# Patient Record
Sex: Male | Born: 1990 | Race: White | Hispanic: No | Marital: Single | Smoking: Never smoker
Health system: Southern US, Community
[De-identification: ages and names within clinical notes are randomized; demographics above are authoritative.]

## PROBLEM LIST (undated history)

## (undated) HISTORY — PX: WISDOM TOOTH EXTRACTION: SHX21

---

## 2013-06-06 ENCOUNTER — Other Ambulatory Visit: Payer: Self-pay | Admitting: Occupational Medicine

## 2013-06-06 ENCOUNTER — Ambulatory Visit
Admission: RE | Admit: 2013-06-06 | Discharge: 2013-06-06 | Disposition: A | Discharge status: Home / Self Care | Payer: No Typology Code available for payment source | Source: Ambulatory Visit | Attending: Occupational Medicine | Admitting: Occupational Medicine

## 2013-06-06 DIAGNOSIS — Z021 Encounter for pre-employment examination: Secondary | ICD-10-CM

## 2013-10-21 ENCOUNTER — Emergency Department (HOSPITAL_COMMUNITY): Payer: Worker's Compensation

## 2013-10-21 ENCOUNTER — Encounter (HOSPITAL_COMMUNITY): Payer: Self-pay | Admitting: Emergency Medicine

## 2013-10-21 ENCOUNTER — Emergency Department (HOSPITAL_COMMUNITY)
Admission: EM | Admit: 2013-10-21 | Discharge: 2013-10-21 | Disposition: A | Discharge status: Home / Self Care | Payer: Worker's Compensation | Attending: Emergency Medicine | Admitting: Emergency Medicine

## 2013-10-21 DIAGNOSIS — Y9289 Other specified places as the place of occurrence of the external cause: Secondary | ICD-10-CM | POA: Diagnosis not present

## 2013-10-21 DIAGNOSIS — IMO0002 Reserved for concepts with insufficient information to code with codable children: Secondary | ICD-10-CM | POA: Insufficient documentation

## 2013-10-21 DIAGNOSIS — R040 Epistaxis: Secondary | ICD-10-CM | POA: Insufficient documentation

## 2013-10-21 DIAGNOSIS — S022XXA Fracture of nasal bones, initial encounter for closed fracture: Secondary | ICD-10-CM

## 2013-10-21 DIAGNOSIS — Y9389 Activity, other specified: Secondary | ICD-10-CM | POA: Insufficient documentation

## 2013-10-21 NOTE — Discharge Instructions (Signed)
1. Medications: ibuprofen for pain contrrol, usual home medications 2. Treatment: rest, drink plenty of fluids, ice 3. Follow Up: Please followup with your primary doctor for discussion of your diagnoses and further evaluation after today's visit; if you do not have a primary care doctor use the resource guide provided to find one;     Nasal Fracture A nasal fracture is a break or crack in the bones of the nose. A minor break usually heals in a month. You often will receive black eyes from a nasal fracture. This is not a cause for concern. The black eyes will go away over 1 to 2 weeks.  DIAGNOSIS  Your caregiver may want to examine you if you are concerned about a fracture of the nose. X-rays of the nose may not show a nasal fracture even when one is present. Sometimes your caregiver must wait 1 to 5 days after the injury to re-check the nose for alignment and to take additional X-rays. Sometimes the caregiver must wait until the swelling has gone down. TREATMENT Minor fractures that have caused no deformity often do not require treatment. More serious fractures where bones are displaced may require surgery. This will take place after the swelling is gone. Surgery will stabilize and align the fracture. HOME CARE INSTRUCTIONS   Put ice on the injured area.  Put ice in a plastic bag.  Place a towel between your skin and the bag.  Leave the ice on for 15-20 minutes, 03-04 times a day.  Take medications as directed by your caregiver.  Only take over-the-counter or prescription medicines for pain, discomfort, or fever as directed by your caregiver.  If your nose starts bleeding, squeeze the soft parts of the nose against the center wall while you are sitting in an upright position for 10 minutes.  Contact sports should be avoided for at least 3 to 4 weeks or as directed by your caregiver. SEEK MEDICAL CARE IF:  Your pain increases or becomes severe.  You continue to have  nosebleeds.  The shape of your nose does not return to normal within 5 days.  You have pus draining from the nose. SEEK IMMEDIATE MEDICAL CARE IF:   You have bleeding from your nose that does not stop after 20 minutes of pinching the nostrils closed and keeping ice on the nose.  You have clear fluid draining from your nose.  You notice a grape-like swelling on the dividing wall between the nostrils (septum). This is a collection of blood (hematoma) that must be drained to help prevent infection.  You have difficulty moving your eyes.  You have recurrent vomiting. Document Released: 03/11/2000 Document Revised: 06/06/2011 Document Reviewed: 06/28/2010 [facility] Patient Information 2015 [employer], [place]. This information is not intended to replace advice given to you by your health care provider. Make sure you discuss any questions you have with your health care provider.

## 2013-10-21 NOTE — ED Notes (Signed)
Declined W/C at D/C and was escorted to lobby by RN. 
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
## 2013-10-22 NOTE — ED Provider Notes (Signed)
  Medical screening examination/treatment/procedure(s) were performed by non-physician practitioner and as supervising physician I was immediately available for consultation/collaboration.   EKG Interpretation None         Alka Falwell, MD 10/22/13 0021 

## 2015-06-01 IMAGING — CR DG NASAL BONES 3+V
3 series · 3 of 3 positions shown · non-contrast
Comparison: None.

CLINICAL DATA: Nose pain secondary to blunt trauma.

EXAM:
NASAL BONES - 3+ VIEW

[w waters]
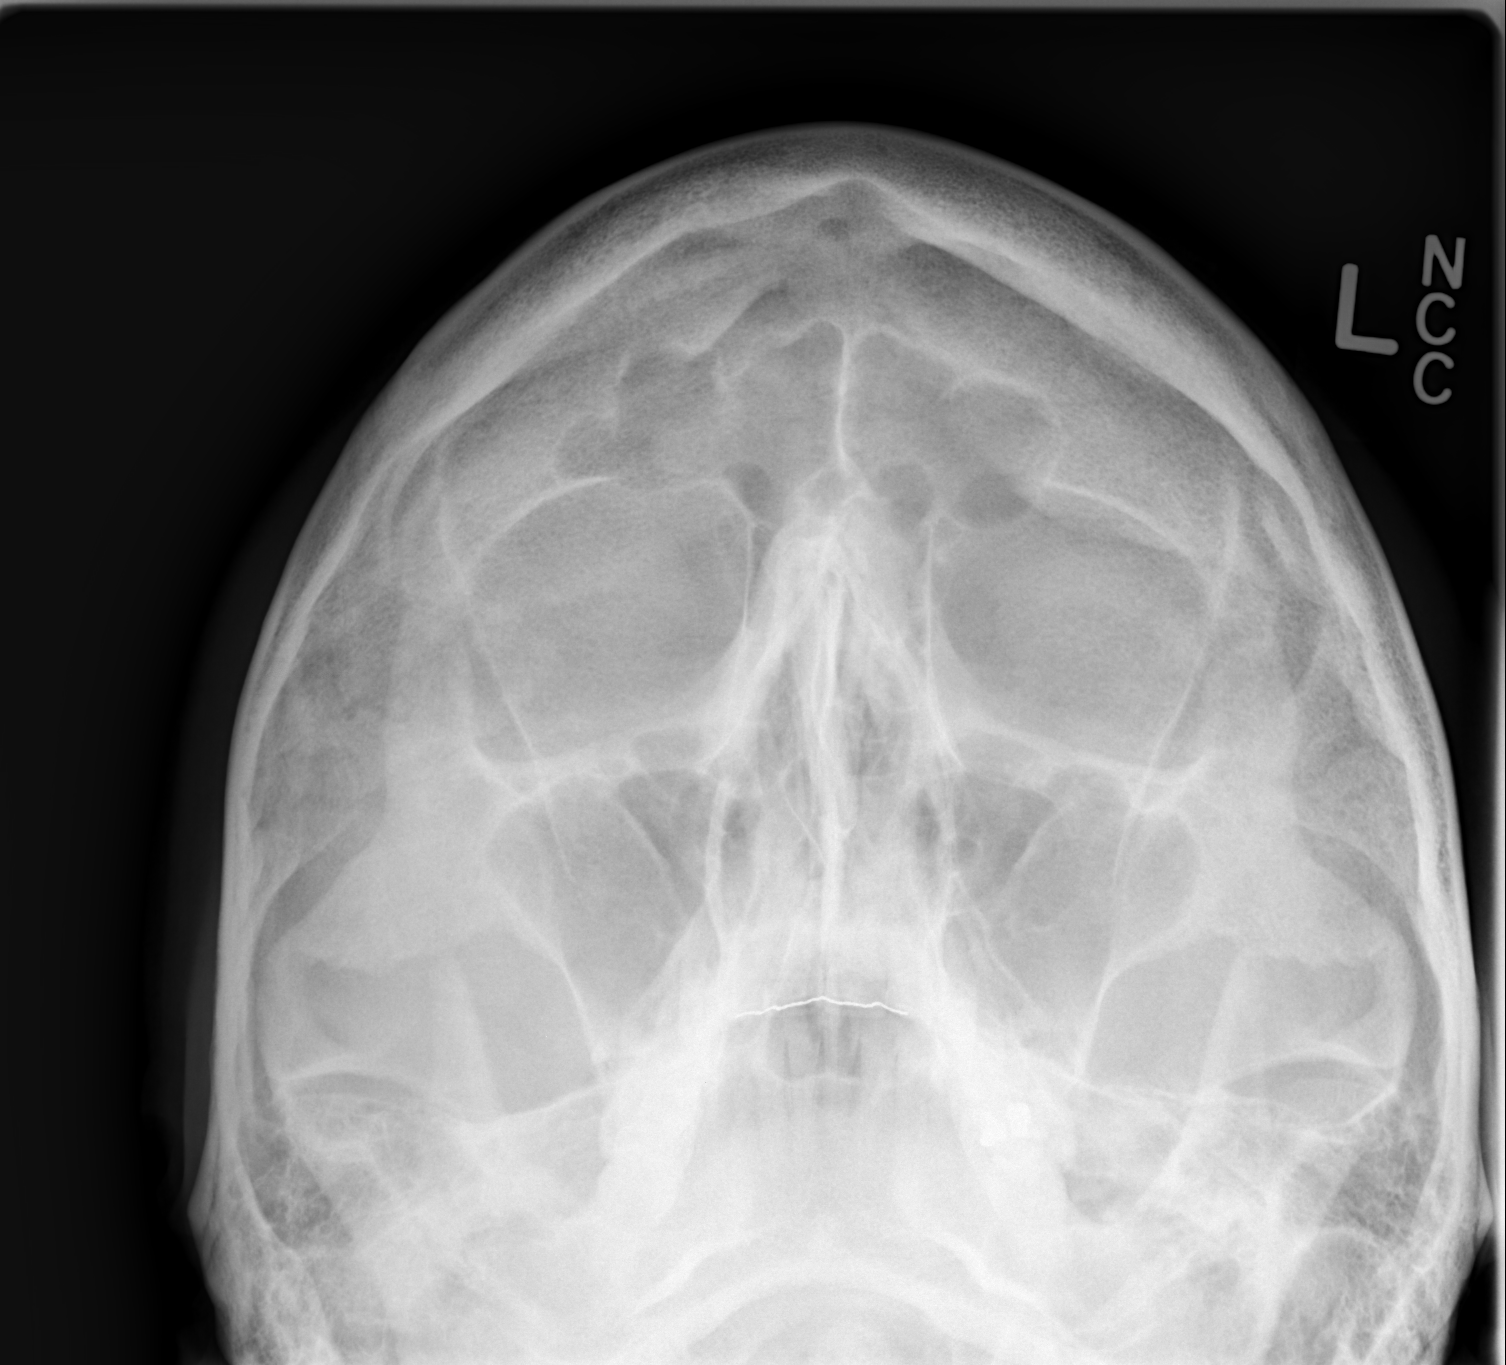

[w nasal bone lat * (1 of 2)]
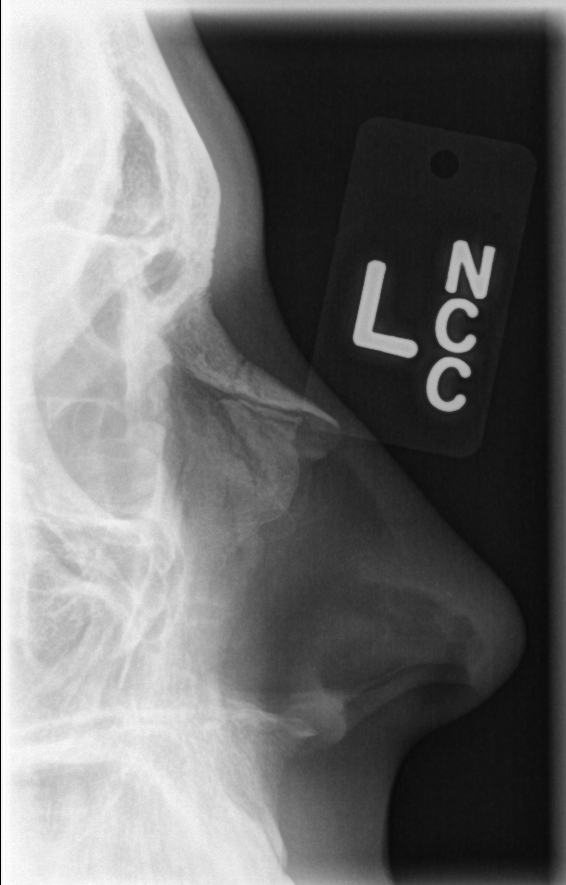

[w nasal bone lat * (2 of 2)]
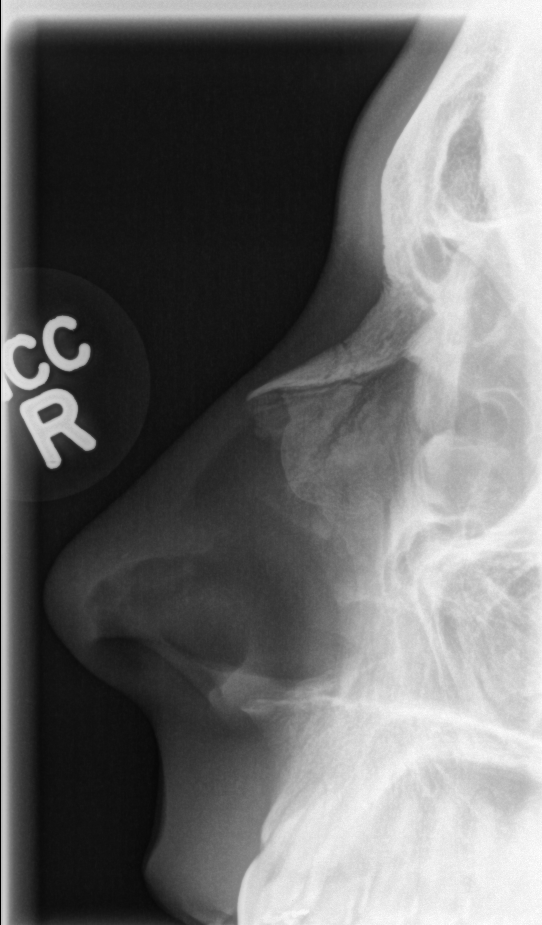

[3 of 3 positions shown; findings below may reference images not displayed]

FINDINGS: There is a fracture of the nasal bone with slight impaction of the
left side of the bone. The paranasal sinuses are clear.
IMPRESSION: Nasal bone fracture with slight impaction of the left side of the
nasal bone.
